# Patient Record
Sex: Female | Born: 1995 | Race: Black or African American | Hispanic: No | Marital: Single | Smoking: Never smoker
Health system: Southern US, Community
[De-identification: ages and names within clinical notes are randomized; demographics above are authoritative.]

## PROBLEM LIST (undated history)

## (undated) ENCOUNTER — Inpatient Hospital Stay (HOSPITAL_COMMUNITY): Payer: Self-pay

## (undated) DIAGNOSIS — R011 Cardiac murmur, unspecified: Secondary | ICD-10-CM

---

## 2016-06-28 ENCOUNTER — Ambulatory Visit: Payer: Self-pay | Admitting: Obstetrics & Gynecology

## 2016-06-29 ENCOUNTER — Inpatient Hospital Stay (HOSPITAL_COMMUNITY)
Admission: AD | Admit: 2016-06-29 | Discharge: 2016-06-29 | Disposition: A | Discharge status: Home / Self Care | Payer: BLUE CROSS/BLUE SHIELD | Source: Ambulatory Visit | Attending: Obstetrics & Gynecology | Admitting: Obstetrics & Gynecology

## 2016-06-29 ENCOUNTER — Encounter (HOSPITAL_COMMUNITY): Payer: Self-pay | Admitting: *Deleted

## 2016-06-29 ENCOUNTER — Inpatient Hospital Stay (HOSPITAL_COMMUNITY): Payer: BLUE CROSS/BLUE SHIELD

## 2016-06-29 DIAGNOSIS — O468X1 Other antepartum hemorrhage, first trimester: Secondary | ICD-10-CM

## 2016-06-29 DIAGNOSIS — R109 Unspecified abdominal pain: Secondary | ICD-10-CM | POA: Diagnosis present

## 2016-06-29 DIAGNOSIS — O98311 Other infections with a predominantly sexual mode of transmission complicating pregnancy, first trimester: Secondary | ICD-10-CM | POA: Insufficient documentation

## 2016-06-29 DIAGNOSIS — Z3A01 Less than 8 weeks gestation of pregnancy: Secondary | ICD-10-CM | POA: Diagnosis not present

## 2016-06-29 DIAGNOSIS — B9689 Other specified bacterial agents as the cause of diseases classified elsewhere: Secondary | ICD-10-CM | POA: Diagnosis not present

## 2016-06-29 DIAGNOSIS — O209 Hemorrhage in early pregnancy, unspecified: Secondary | ICD-10-CM | POA: Diagnosis not present

## 2016-06-29 DIAGNOSIS — Z3201 Encounter for pregnancy test, result positive: Secondary | ICD-10-CM | POA: Diagnosis not present

## 2016-06-29 DIAGNOSIS — O418X10 Other specified disorders of amniotic fluid and membranes, first trimester, not applicable or unspecified: Secondary | ICD-10-CM

## 2016-06-29 DIAGNOSIS — A599 Trichomoniasis, unspecified: Secondary | ICD-10-CM | POA: Diagnosis not present

## 2016-06-29 DIAGNOSIS — Z3A10 10 weeks gestation of pregnancy: Secondary | ICD-10-CM

## 2016-06-29 DIAGNOSIS — N939 Abnormal uterine and vaginal bleeding, unspecified: Secondary | ICD-10-CM | POA: Diagnosis present

## 2016-06-29 DIAGNOSIS — O26899 Other specified pregnancy related conditions, unspecified trimester: Secondary | ICD-10-CM

## 2016-06-29 HISTORY — DX: Cardiac murmur, unspecified: R01.1

## 2016-06-29 LAB — WET PREP, GENITAL
CLUE CELLS WET PREP: NONE SEEN
SPERM: NONE SEEN
Yeast Wet Prep HPF POC: NONE SEEN

## 2016-06-29 LAB — URINALYSIS, ROUTINE W REFLEX MICROSCOPIC
Bacteria, UA: NONE SEEN
Bilirubin Urine: NEGATIVE
GLUCOSE, UA: NEGATIVE mg/dL
KETONES UR: NEGATIVE mg/dL
Nitrite: NEGATIVE
PROTEIN: NEGATIVE mg/dL
Specific Gravity, Urine: 1.02 (ref 1.005–1.030)
pH: 6 (ref 5.0–8.0)

## 2016-06-29 LAB — CBC
HCT: 34.4 % — ABNORMAL LOW (ref 36.0–46.0)
HEMOGLOBIN: 11 g/dL — AB (ref 12.0–15.0)
MCH: 23.1 pg — AB (ref 26.0–34.0)
MCHC: 32 g/dL (ref 30.0–36.0)
MCV: 72.3 fL — AB (ref 78.0–100.0)
Platelets: 247 10*3/uL (ref 150–400)
RBC: 4.76 MIL/uL (ref 3.87–5.11)
RDW: 16.7 % — ABNORMAL HIGH (ref 11.5–15.5)
WBC: 7.1 10*3/uL (ref 4.0–10.5)

## 2016-06-29 LAB — ABO/RH: ABO/RH(D): O NEG

## 2016-06-29 LAB — POCT PREGNANCY, URINE: Preg Test, Ur: POSITIVE — AB

## 2016-06-29 LAB — HCG, QUANTITATIVE, PREGNANCY: HCG, BETA CHAIN, QUANT, S: 5673 m[IU]/mL — AB (ref <5)

## 2016-06-29 MED ORDER — METRONIDAZOLE 500 MG PO TABS
2000.0000 mg | ORAL_TABLET | Freq: Once | ORAL | Status: AC
  Administered 2016-06-29: 2000 mg via ORAL
  Filled 2016-06-29: qty 4

## 2016-06-29 MED ORDER — RHO D IMMUNE GLOBULIN 1500 UNIT/2ML IJ SOSY
300.0000 ug | PREFILLED_SYRINGE | Freq: Once | INTRAMUSCULAR | Status: AC
  Administered 2016-06-29: 300 ug via INTRAMUSCULAR
  Filled 2016-06-29: qty 2

## 2016-06-29 NOTE — MAU Provider Note (Signed)
History   [id number]   Chief Complaint  Patient presents with  . Abdominal Cramping  . Vaginal Bleeding    HPI Debra Bryant is a 21 y.o. female  G1P0 here at presumed [redacted] wks gestation by LMP with report of vaginal bleeding this morning while wiping and lower pelvic cramping, not unilateral in nature.  Pain is rated a 5/10.  Bleeding is described as small amount and occurring x 1.  Plans to receive prenatal care at [facility] in [place].  Lives in [place], however FOB desires her to come to his city of residence for care.     Patient's last menstrual period was 04/19/2016.  OB History  Gravida Para Term Preterm AB Living  1            SAB TAB Ectopic Multiple Live Births               # Outcome Date GA Lbr Len/2nd Weight Sex Delivery Anes PTL Lv  1 Current               Past Medical History:  Diagnosis Date  . Heart murmur     History reviewed. No pertinent family history.  Social History   Social History  . Marital status: Single    Spouse name: N/A  . Number of children: N/A  . Years of education: N/A   Social History Main Topics  . Smoking status: Never Smoker  . Smokeless tobacco: None  . Alcohol use No  . Drug use: No  . Sexual activity: Yes   Other Topics Concern  . None   Social History Narrative  . None    Allergies no known allergies  No current facility-administered medications on file prior to encounter.    No current outpatient prescriptions on file prior to encounter.     Review of Systems  Constitutional: Negative for chills and fever.  Gastrointestinal: Positive for nausea. Negative for constipation, diarrhea and vomiting.  Genitourinary: Positive for pelvic pain (cramping) and vaginal bleeding. Negative for vaginal discharge and vaginal pain.  Neurological: Negative for headaches.  All other systems reviewed and are negative.    Physical Exam   Vitals:   06/29/16 0758  BP: 124/80  Pulse: 93  Resp: 18  Temp: 98.7 F  (37.1 C)  Weight: 178 lb 1.3 oz (80.8 kg)  Height:  (1.651 m)    Physical Exam  Constitutional: She is oriented to person, place, and time. She appears well-developed and well-nourished.  HENT:  Head: Normocephalic.  Neck: Normal range of motion. Neck supple.  Cardiovascular: Normal rate, regular rhythm and normal heart sounds.   Respiratory: Effort normal and breath sounds normal. No respiratory distress.  GI: Soft. There is no tenderness.  Genitourinary: Uterus is enlarged. Cervix exhibits no motion tenderness. There is bleeding (scant; tissue like appearance on cervix) in the vagina.  Musculoskeletal: Normal range of motion. She exhibits no edema.  Neurological: She is alert and oriented to person, place, and time.  Skin: Skin is warm and dry.    MAU Course  Procedures  MDM Results for orders placed or performed during the hospital encounter of 06/29/16 (from the past 24 hour(s))  Urinalysis, Routine w reflex microscopic     Status: Abnormal   Collection Time: 06/29/16  8:01 AM  Result Value Ref Range   Color, Urine YELLOW YELLOW   APPearance HAZY (A) CLEAR   Specific Gravity, Urine 1.020 1.005 - 1.030   pH 6.0 5.0 -  8.0   Glucose, UA NEGATIVE NEGATIVE mg/dL   Hgb urine dipstick SMALL (A) NEGATIVE   Bilirubin Urine NEGATIVE NEGATIVE   Ketones, ur NEGATIVE NEGATIVE mg/dL   Protein, ur NEGATIVE NEGATIVE mg/dL   Nitrite NEGATIVE NEGATIVE   Leukocytes, UA TRACE (A) NEGATIVE   RBC / HPF 0-5 0 - 5 RBC/hpf   WBC, UA 6-30 0 - 5 WBC/hpf   Bacteria, UA NONE SEEN NONE SEEN   Squamous Epithelial / LPF 6-30 (A) NONE SEEN   Mucous PRESENT   Pregnancy, urine POC     Status: Abnormal   Collection Time: 06/29/16  8:18 AM  Result Value Ref Range   Preg Test, Ur POSITIVE (A) NEGATIVE  Wet prep, genital     Status: Abnormal   Collection Time: 06/29/16  9:23 AM  Result Value Ref Range   Yeast Wet Prep HPF POC NONE SEEN NONE SEEN   Trich, Wet Prep PRESENT (A) NONE SEEN   Clue  Cells Wet Prep HPF POC NONE SEEN NONE SEEN   WBC, Wet Prep HPF POC FEW (A) NONE SEEN   Sperm NONE SEEN   CBC     Status: Abnormal   Collection Time: 06/29/16  9:25 AM  Result Value Ref Range   WBC 7.1 4.0 - 10.5 K/uL   RBC 4.76 3.87 - 5.11 MIL/uL   Hemoglobin 11.0 (L) 12.0 - 15.0 g/dL   HCT 16.1 (L) 09.6 - 04.5 %   MCV 72.3 (L) 78.0 - 100.0 fL   MCH 23.1 (L) 26.0 - 34.0 pg   MCHC 32.0 30.0 - 36.0 g/dL   RDW 40.9 (H) 81.1 - 91.4 %   Platelets 247 150 - 400 K/uL  hCG, quantitative, pregnancy     Status: Abnormal   Collection Time: 06/29/16  9:25 AM  Result Value Ref Range   hCG, Beta Chain, Quant, S 5,673 (H) <5 mIU/mL  ABO/Rh     Status: None   Collection Time: 06/29/16  9:25 AM  Result Value Ref Range   ABO/RH(D) O NEG    FINDINGS: Intrauterine gestational sac: Single  Yolk sac:  Not visualized  Embryo:  Not visualized  Cardiac Activity: Not visualized  Heart Rate:   bpm  MSD: 15.7  mm   6 w   2  d  CRL:    mm    w    d                  [place] EDC:  Subchorionic hemorrhage:  Small subchorionic hemorrhage  Maternal uterus/adnexae: No adnexal masses or free fluid. Ultrasound: IMPRESSION: Single intrauterine gestational sac without yolk sac or fetal pole currently. This could be followed with repeat ultrasound in 10-14 days to ensure expected progression.  Small subchorionic hemorrhage.  Assessment and Plan  21 y.o. G1P0 at 6 wks by IUGS > need fetal pole Trichomoniasis Subchorionic Hemorrhage RH negative  Plan: 2 GM flagyl in MAU Rhophylac in MAU Discussed transmission and need for partner treatment Follow-up ultrasound in one week Reviewed bleeding precautions  Marlis Edelson, CNM 06/29/2016 11:59 AM

## 2016-06-29 NOTE — MAU Note (Signed)
Pt reports she went to the doctor yesterday. Went to the BR this mornings had a cramp and wiped and saw some blood. Had intercourse yesterday.

## 2016-06-30 LAB — GC/CHLAMYDIA PROBE AMP (~~LOC~~) NOT AT ARMC
CHLAMYDIA, DNA PROBE: NEGATIVE
NEISSERIA GONORRHEA: NEGATIVE

## 2016-06-30 LAB — RH IG WORKUP (INCLUDES ABO/RH)
ABO/RH(D): O NEG
Antibody Screen: NEGATIVE
GESTATIONAL AGE(WKS): 5
Unit division: 0

## 2016-06-30 LAB — HIV ANTIBODY (ROUTINE TESTING W REFLEX): HIV Screen 4th Generation wRfx: NONREACTIVE

## 2016-07-02 ENCOUNTER — Encounter (HOSPITAL_COMMUNITY): Payer: Self-pay | Admitting: *Deleted

## 2016-07-02 ENCOUNTER — Inpatient Hospital Stay (HOSPITAL_COMMUNITY)
Admission: AD | Admit: 2016-07-02 | Discharge: 2016-07-02 | Disposition: A | Discharge status: Home / Self Care | Payer: BLUE CROSS/BLUE SHIELD | Source: Ambulatory Visit | Attending: Obstetrics & Gynecology | Admitting: Obstetrics & Gynecology

## 2016-07-02 DIAGNOSIS — O209 Hemorrhage in early pregnancy, unspecified: Secondary | ICD-10-CM | POA: Diagnosis present

## 2016-07-02 DIAGNOSIS — K625 Hemorrhage of anus and rectum: Secondary | ICD-10-CM | POA: Insufficient documentation

## 2016-07-02 DIAGNOSIS — R109 Unspecified abdominal pain: Secondary | ICD-10-CM | POA: Diagnosis not present

## 2016-07-02 DIAGNOSIS — Z3A10 10 weeks gestation of pregnancy: Secondary | ICD-10-CM | POA: Diagnosis not present

## 2016-07-02 DIAGNOSIS — O26891 Other specified pregnancy related conditions, first trimester: Secondary | ICD-10-CM | POA: Diagnosis not present

## 2016-07-02 DIAGNOSIS — O0281 Inappropriate change in quantitative human chorionic gonadotropin (hCG) in early pregnancy: Secondary | ICD-10-CM | POA: Diagnosis not present

## 2016-07-02 LAB — CBC
HEMATOCRIT: 33.7 % — AB (ref 36.0–46.0)
Hemoglobin: 10.8 g/dL — ABNORMAL LOW (ref 12.0–15.0)
MCH: 23.1 pg — AB (ref 26.0–34.0)
MCHC: 32 g/dL (ref 30.0–36.0)
MCV: 72.2 fL — AB (ref 78.0–100.0)
PLATELETS: 229 10*3/uL (ref 150–400)
RBC: 4.67 MIL/uL (ref 3.87–5.11)
RDW: 16.5 % — ABNORMAL HIGH (ref 11.5–15.5)
WBC: 7 10*3/uL (ref 4.0–10.5)

## 2016-07-02 LAB — URINALYSIS, ROUTINE W REFLEX MICROSCOPIC
BILIRUBIN URINE: NEGATIVE
Glucose, UA: NEGATIVE mg/dL
Ketones, ur: NEGATIVE mg/dL
Leukocytes, UA: NEGATIVE
NITRITE: NEGATIVE
PH: 7 (ref 5.0–8.0)
Protein, ur: NEGATIVE mg/dL
SPECIFIC GRAVITY, URINE: 1.013 (ref 1.005–1.030)

## 2016-07-02 LAB — HCG, QUANTITATIVE, PREGNANCY: hCG, Beta Chain, Quant, S: 2983 m[IU]/mL — ABNORMAL HIGH (ref <5)

## 2016-07-02 NOTE — MAU Note (Signed)
Has had some light spotting since Thursday, some today and some bleeding from the rectum as well.  She feels like her job is stressing her out.  Also having some mild cramping.

## 2016-07-02 NOTE — MAU Provider Note (Signed)
History     CSN: [id number]  Arrival date and time: 07/02/16 1425  First Provider Initiated Contact with Patient 07/02/16 1519      Chief Complaint  Patient presents with  . Vaginal Bleeding   HPI  Debra Bryant is a 21 y.o. G1P0 at [redacted]w[redacted]d by LMP who presents with rectal & vaginal bleeding. Patient was seen in MAU on 4/26 for vaginal bleeding; had IUGS (no yolk sac) & small [facility] w/BHCG of 5,673. Patient was discharged home with outpatient f/u ultrasound for viability.  Patient reports pink/red spotting has continued since Tuesday. Reports small amount of red rectal bleeding on toilet paper after straining for a BM earlier today. Denies constipation; reports daily BM & that stool is soft. Since BM has had some mild abdominal cramping. Rates pain 4/10. Has not treated. Pain comes & goes.  Denies n/v, dysuria. No intercourse since previous visit.   OB History    Gravida Para Term Preterm AB Living   1             SAB TAB Ectopic Multiple Live Births                  Past Medical History:  Diagnosis Date  . Heart murmur     History reviewed. No pertinent surgical history.  History reviewed. No pertinent family history.  Social History  Substance Use Topics  . Smoking status: Never Smoker  . Smokeless tobacco: Never Used  . Alcohol use No    Allergies: No Known Allergies  Prescriptions Prior to Admission  Medication Sig Dispense Refill Last Dose  . Prenatal Vit-Fe Fumarate-FA (PRENATAL MULTIVITAMIN) TABS tablet Take 1 tablet by mouth daily at 12 noon.   07/02/2016 at Unknown time    Review of Systems  Constitutional: Negative.   Gastrointestinal: Positive for abdominal pain and anal bleeding. Negative for blood in stool, constipation, diarrhea, nausea, rectal pain and vomiting.  Genitourinary: Positive for vaginal bleeding. Negative for dysuria and vaginal discharge.   Physical Exam   Blood pressure 121/73, pulse 93, temperature 97.7 F (36.5 C), temperature source  Oral, resp. rate 16, last menstrual period 04/19/2016.  Physical Exam  Nursing note and vitals reviewed. Constitutional: She is oriented to person, place, and time. She appears well-developed and well-nourished. No distress.  HENT:  Head: Normocephalic and atraumatic.  Eyes: Conjunctivae are normal. Right eye exhibits no discharge. Left eye exhibits no discharge. No scleral icterus.  Neck: Normal range of motion.  Cardiovascular: Normal rate, regular rhythm and normal heart sounds.   No murmur heard. Respiratory: Effort normal and breath sounds normal. No respiratory distress. She has no wheezes.  GI: Soft. Bowel sounds are normal. She exhibits no distension. There is no tenderness. There is no rebound.  Genitourinary: Rectum normal. Rectal exam shows no external hemorrhoid and no fissure. No bleeding in the vagina.  Genitourinary Comments: Cervix closed  Neurological: She is alert and oriented to person, place, and time.  Skin: Skin is warm and dry. She is not diaphoretic.  Psychiatric: She has a normal mood and affect. Her behavior is normal. Judgment and thought content normal.    MAU Course  Procedures Results for orders placed or performed during the hospital encounter of 07/02/16 (from the past 24 hour(s))  Urinalysis, Routine w reflex microscopic     Status: Abnormal   Collection Time: 07/02/16  2:44 PM  Result Value Ref Range   Color, Urine YELLOW YELLOW   APPearance CLEAR CLEAR   Specific  Gravity, Urine 1.013 1.005 - 1.030   pH 7.0 5.0 - 8.0   Glucose, UA NEGATIVE NEGATIVE mg/dL   Hgb urine dipstick SMALL (A) NEGATIVE   Bilirubin Urine NEGATIVE NEGATIVE   Ketones, ur NEGATIVE NEGATIVE mg/dL   Protein, ur NEGATIVE NEGATIVE mg/dL   Nitrite NEGATIVE NEGATIVE   Leukocytes, UA NEGATIVE NEGATIVE   RBC / HPF 0-5 0 - 5 RBC/hpf   WBC, UA 0-5 0 - 5 WBC/hpf   Bacteria, UA RARE (A) NONE SEEN   Squamous Epithelial / LPF 0-5 (A) NONE SEEN  CBC     Status: Abnormal   Collection  Time: 07/02/16  4:10 PM  Result Value Ref Range   WBC 7.0 4.0 - 10.5 K/uL   RBC 4.67 3.87 - 5.11 MIL/uL   Hemoglobin 10.8 (L) 12.0 - 15.0 g/dL   HCT 82.9 (L) 56.2 - 13.0 %   MCV 72.2 (L) 78.0 - 100.0 fL   MCH 23.1 (L) 26.0 - 34.0 pg   MCHC 32.0 30.0 - 36.0 g/dL   RDW 86.5 (H) 78.4 - 69.6 %   Platelets 229 150 - 400 K/uL  hCG, quantitative, pregnancy     Status: Abnormal   Collection Time: 07/02/16  4:10 PM  Result Value Ref Range   hCG, Beta Chain, Quant, S 2,983 (H) <5 mIU/mL    MDM O negative -- rhogham given 4/26 Cervix closed, no blood on exam BHCG -- dropped -- 2952>8413 Hemoglobin stable VSS, NAD Assessment and Plan  A: 1. Vaginal bleeding in pregnancy, first trimester   2. Inappropriate change in quantitative hCG in early pregnancy    P: Discharge home Go to [facility] Wednesday morning for repeat BHCG -- if continues to drop will f/u for miscarriage appt Discussed reasons to return to MAU Pelvic rest  Judeth Horn 07/02/2016, 3:19 PM

## 2016-07-02 NOTE — Discharge Instructions (Signed)
Return to care  °· If you have heavier bleeding that soaks through more that 2 pads per hour for an hour or more °· If you bleed so much that you feel like you might pass out or you do pass out °· If you have significant abdominal pain that is not improved with Tylenol  °· If you develop a fever > 100.5 ° ° ° ° °Miscarriage °A miscarriage is the sudden loss of an unborn baby (fetus) before the 20th week of pregnancy. Most miscarriages happen in the first 3 months of pregnancy. Sometimes, it happens before a woman even knows she is pregnant. A miscarriage is also called a "spontaneous miscarriage" or "early pregnancy loss." Having a miscarriage can be an emotional experience. Talk with your caregiver about any questions you may have about miscarrying, the grieving process, and your future pregnancy plans. °What are the causes? °· Problems with the fetal chromosomes that make it impossible for the baby to develop normally. Problems with the baby's genes or chromosomes are most often the result of errors that occur, by chance, as the embryo divides and grows. The problems are not inherited from the parents. °· Infection of the cervix or uterus. °· Hormone problems. °· Problems with the cervix, such as having an incompetent cervix. This is when the tissue in the cervix is not strong enough to hold the pregnancy. °· Problems with the uterus, such as an abnormally shaped uterus, uterine fibroids, or congenital abnormalities. °· Certain medical conditions. °· Smoking, drinking alcohol, or taking illegal drugs. °· Trauma. °Often, the cause of a miscarriage is unknown. °What are the signs or symptoms? °· Vaginal bleeding or spotting, with or without cramps or pain. °· Pain or cramping in the abdomen or lower back. °· Passing fluid, tissue, or blood clots from the vagina. °How is this diagnosed? °Your caregiver will perform a physical exam. You may also have an ultrasound to confirm the miscarriage. Blood or urine tests may  also be ordered. °How is this treated? °· Sometimes, treatment is not necessary if you naturally pass all the fetal tissue that was in the uterus. If some of the fetus or placenta remains in the body (incomplete miscarriage), tissue left behind may become infected and must be removed. Usually, a dilation and curettage (D and C) procedure is performed. During a D and C procedure, the cervix is widened (dilated) and any remaining fetal or placental tissue is gently removed from the uterus. °· Antibiotic medicines are prescribed if there is an infection. Other medicines may be given to reduce the size of the uterus (contract) if there is a lot of bleeding. °· If you have Rh negative blood and your baby was Rh positive, you will need a Rh immunoglobulin shot. This shot will protect any future baby from having Rh blood problems in future pregnancies. °Follow these instructions at home: °· Your caregiver may order bed rest or may allow you to continue light activity. Resume activity as directed by your caregiver. °· Have someone help with home and family responsibilities during this time. °· Keep track of the number of sanitary pads you use each day and how soaked (saturated) they are. Write down this information. °· Do not use tampons. Do not douche or have sexual intercourse until approved by your caregiver. °· Only take over-the-counter or prescription medicines for pain or discomfort as directed by your caregiver. °· Do not take aspirin. Aspirin can cause bleeding. °· Keep all follow-up appointments with your caregiver. °·   If you or your partner have problems with grieving, talk to your caregiver or seek counseling to help cope with the pregnancy loss. Allow enough time to grieve before trying to get pregnant again. °Get help right away if: °· You have severe cramps or pain in your back or abdomen. °· You have a fever. °· You pass large blood clots (walnut-sized or larger) or tissue from your vagina. Save any tissue  for your caregiver to inspect. °· Your bleeding increases. °· You have a thick, bad-smelling vaginal discharge. °· You become lightheaded, weak, or you faint. °· You have chills. °This information is not intended to replace advice given to you by your health care provider. Make sure you discuss any questions you have with your health care provider. °Document Released: 08/16/2000 Document Revised: 07/29/2015 Document Reviewed: 04/11/2011 °Elsevier Interactive Patient Education © 2017 [employer] Inc. ° °

## 2016-07-05 ENCOUNTER — Telehealth: Payer: Self-pay | Admitting: General Practice

## 2016-07-05 ENCOUNTER — Ambulatory Visit: Payer: BLUE CROSS/BLUE SHIELD

## 2016-07-05 NOTE — Telephone Encounter (Signed)
Patient no showed for stat bhcg appt. Called patient and she states she told them she couldn't make it this week because she is in [place]. Patient states she won't be back until next week but can come Monday 5/7 @ 330 after work. Patient had no questions

## 2016-07-10 ENCOUNTER — Inpatient Hospital Stay (HOSPITAL_COMMUNITY)
Admission: AD | Admit: 2016-07-10 | Discharge: 2016-07-10 | Disposition: A | Discharge status: Home / Self Care | Payer: BLUE CROSS/BLUE SHIELD | Source: Ambulatory Visit | Attending: Obstetrics & Gynecology | Admitting: Obstetrics & Gynecology

## 2016-07-10 ENCOUNTER — Ambulatory Visit: Payer: BLUE CROSS/BLUE SHIELD

## 2016-07-10 ENCOUNTER — Inpatient Hospital Stay (HOSPITAL_COMMUNITY): Payer: BLUE CROSS/BLUE SHIELD

## 2016-07-10 DIAGNOSIS — Z3A14 14 weeks gestation of pregnancy: Secondary | ICD-10-CM | POA: Insufficient documentation

## 2016-07-10 DIAGNOSIS — O021 Missed abortion: Secondary | ICD-10-CM | POA: Diagnosis not present

## 2016-07-10 DIAGNOSIS — O209 Hemorrhage in early pregnancy, unspecified: Secondary | ICD-10-CM

## 2016-07-10 DIAGNOSIS — R011 Cardiac murmur, unspecified: Secondary | ICD-10-CM | POA: Diagnosis not present

## 2016-07-10 DIAGNOSIS — O3680X0 Pregnancy with inconclusive fetal viability, not applicable or unspecified: Secondary | ICD-10-CM

## 2016-07-10 MED ORDER — PROMETHAZINE HCL 12.5 MG PO TABS: 12.5000 mg | ORAL_TABLET | Freq: Four times a day (QID) | ORAL | 0 refills | Status: AC | PRN

## 2016-07-10 MED ORDER — MISOPROSTOL 200 MCG PO TABS: ORAL_TABLET | ORAL | 0 refills | Status: AC

## 2016-07-10 MED ORDER — OXYCODONE-ACETAMINOPHEN 5-325 MG PO TABS: 1.0000 | ORAL_TABLET | Freq: Four times a day (QID) | ORAL | 0 refills | Status: AC | PRN

## 2016-07-10 MED ORDER — IBUPROFEN 600 MG PO TABS: 600.0000 mg | ORAL_TABLET | Freq: Four times a day (QID) | ORAL | 0 refills | Status: AC | PRN

## 2016-07-10 NOTE — Discharge Instructions (Signed)

## 2016-07-10 NOTE — MAU Provider Note (Signed)
History     CSN: [id number]  Arrival date and time: 07/10/16 1538   First Provider Initiated Contact with Patient 07/10/16 1627      Chief Complaint  Patient presents with  . Follow-up   HPI  Debra Bryant is a 21 y.o. G1P0 at [redacted]w[redacted]d who presents to MAU today with complaint of need for follow-up. The patient had IUGS only noted on [place] on 06/29/16 and was advised to follow-up for hCG in 48 hours. The patient was out of town for the past week and did not follow-up. She states continued light bleeding off and on with mild cramping. She tried Ibuprofen once with minimal relief.   OB History    Gravida Para Term Preterm AB Living   1             SAB TAB Ectopic Multiple Live Births                  Past Medical History:  Diagnosis Date  . Heart murmur     No past surgical history on file.  No family history on file.  Social History  Substance Use Topics  . Smoking status: Never Smoker  . Smokeless tobacco: Never Used  . Alcohol use No    Allergies: No Known Allergies  No prescriptions prior to admission.    Review of Systems  Constitutional: Negative for fever.  Gastrointestinal: Positive for abdominal pain. Negative for constipation, diarrhea, nausea and vomiting.  Genitourinary: Positive for vaginal bleeding. Negative for dysuria, frequency, urgency and vaginal discharge.   Physical Exam   Blood pressure 117/70, pulse 79, temperature 98.3 F (36.8 C), temperature source Oral, resp. rate 16, weight 182 lb (82.6 kg), last menstrual period 04/19/2016, SpO2 100 %.  Physical Exam  Nursing note and vitals reviewed. Constitutional: She is oriented to person, place, and time. She appears well-developed and well-nourished. No distress.  HENT:  Head: Normocephalic and atraumatic.  Cardiovascular: Normal rate.   Respiratory: Effort normal.  GI: Soft. She exhibits no distension. There is no tenderness.  Neurological: She is alert and oriented to person, place, and  time.  Skin: Skin is warm and dry. No erythema.  Psychiatric: She has a normal mood and affect.    US Ob Transvaginal  Result Date: 07/10/2016 CLINICAL DATA:  Intermittent vaginal bleeding. EXAM: OBSTETRIC <14 WK [place] AND TRANSVAGINAL OB US TECHNIQUE: Both transabdominal and transvaginal ultrasound examinations were performed for complete evaluation of the gestation as well as the maternal uterus, adnexal regions, and pelvic cul-de-sac. Transvaginal technique was performed to assess early pregnancy. COMPARISON:  None. FINDINGS: Intrauterine gestational sac: There is an irregularly-shaped gestational sac identified. Yolk sac:  No Embryo:  None MSD: 17  mm   6 w   3  d Maternal uterus/adnexae: Subchorionic hemorrhage: Small Right ovary: Normal Left ovary: Normal Other :None Free fluid:  Trace free fluid. IMPRESSION: 1. Irregularly configured gestational sac is identified without yolk sac or embryo. It has been 11 days since prior ultrasound demonstrating a single intrauterine gestational sac without yolk sac or fetal pole. Findings are suspicious but not yet definitive for failed pregnancy. Recommend follow-up US in 10-14 days for definitive diagnosis. This recommendation follows SRU consensus guidelines: Diagnostic Criteria for Nonviable Pregnancy Early in the First Trimester. Malva Limes Med 2013; 409:8119-14. Electronically Signed   By: Signa Kell M.D.   On: 07/10/2016 17:16    MAU Course  Procedures None  MDM Since patient has not had follow-up  for > 7 days, will repeat US today TVUS ordered Findings are consistent with likely missed AB/anembryonic pregnancy Discussed patient with Dr. Debroah LoopArnold. Ok to offer Cytotec today.  Discussed options of expectant management vs Cytotec today. Patient would like to try Cytotec.   Early Intrauterine Pregnancy Failure  __x_  Documented intrauterine pregnancy failure less than or equal to [redacted] weeks gestation  _x__  No serious current illness  _x__  Baseline  Hgb greater than or equal to 10g/dl  __x_  Patient has easily accessible transportation to the hospital  __x_  Clear preference  _x__  Practitioner/physician deems patient reliable  __x_  Counseling by practitioner or physician  __N/A_  Patient education by RN  _N/A__  Consent form signed  __x_  Rho-Gam given by RN if indicated  _x__ Medication dispensed   __x_   Cytotec 800 mcg  _x_   Intravaginally by patient at home         __   Intravaginally by RN in MAU        __   Rectally by patient at home        __   Rectally by RN in MAU  _x__  Ibuprofen 600 mg 1 tablet by mouth every 6 hours as needed #30  __x_  Hydrocodone/acetaminophen 5/325 mg by mouth every 4 to 6 hours as needed  __x_  Phenergan 12.5 mg by mouth every 4 hours as needed for nausea     Assessment and Plan  A: Missed AB  P:  Discharge home Rx for Cytotec, Percocet, Ibuprofen and Phenergan given to patient  Bleeding precautions discussed Patient advised to follow-up with [facility] in 1 week for repeat labs and 2 weeks with a provider. Message sent to [facility] to call with appointments.  Patient may return to MAU as needed or if her condition were to change or worsen  Vonzella NippleJulie Venera Privott, PA-C 07/10/2016, 7:45 PM

## 2016-07-10 NOTE — MAU Note (Signed)
Was to have come back last Monday, was out of town.  Received a phone call told them she would come today when she got off of work.  Still bleeding off and on.   No abd pain, reports headaches off and on.

## 2016-07-18 ENCOUNTER — Other Ambulatory Visit: Payer: BLUE CROSS/BLUE SHIELD

## 2016-07-26 ENCOUNTER — Encounter: Payer: BLUE CROSS/BLUE SHIELD | Admitting: Advanced Practice Midwife

## 2017-04-18 ENCOUNTER — Encounter (HOSPITAL_COMMUNITY): Payer: Self-pay

## 2018-06-18 IMAGING — US US OB TRANSVAGINAL
1 series · 15 of 28 positions shown · non-contrast
Comparison: None.

CLINICAL DATA: Intermittent vaginal bleeding.

EXAM:
OBSTETRIC <14 WK US AND TRANSVAGINAL OB US
TECHNIQUE: Both transabdominal and transvaginal ultrasound examinations were
performed for complete evaluation of the gestation as well as the
maternal uterus, adnexal regions, and pelvic cul-de-sac.
Transvaginal technique was performed to assess early pregnancy.

[Series 1: us ob transvaginal · 15 of 37 slices shown]
[im 1/37]
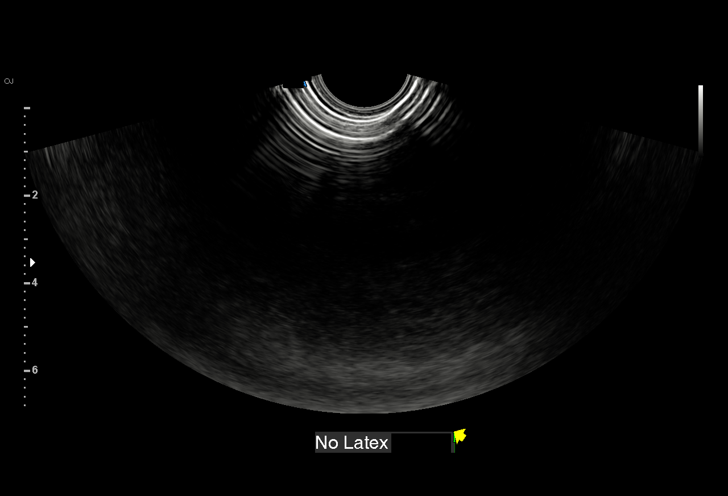
[im 3/37]
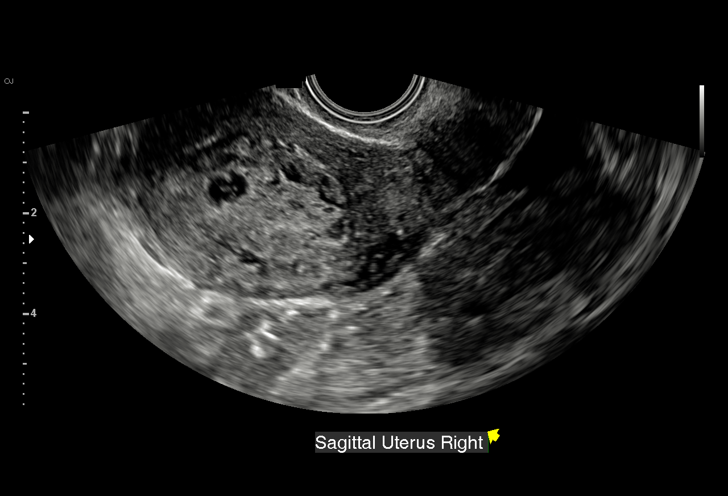
[im 6/37]
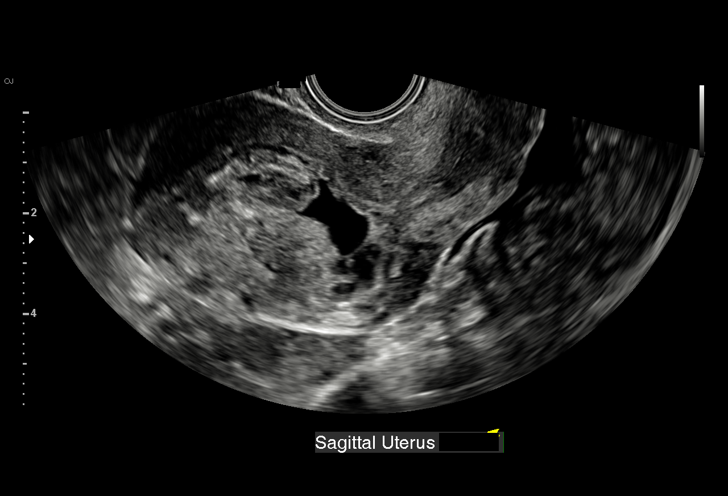
[im 9/37]
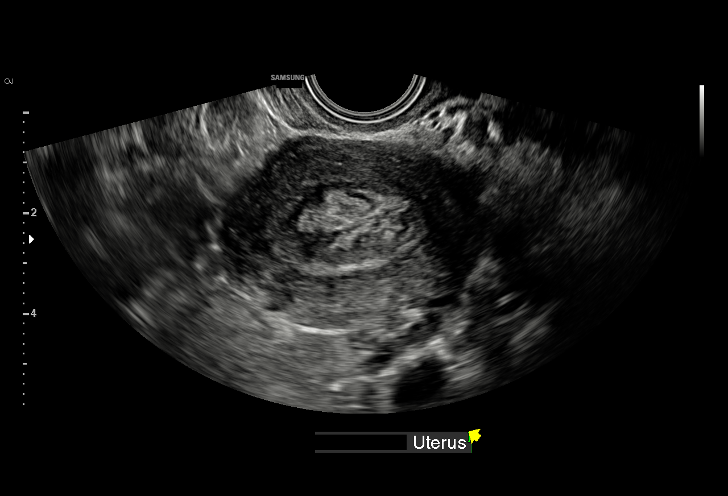
[im 11/37]
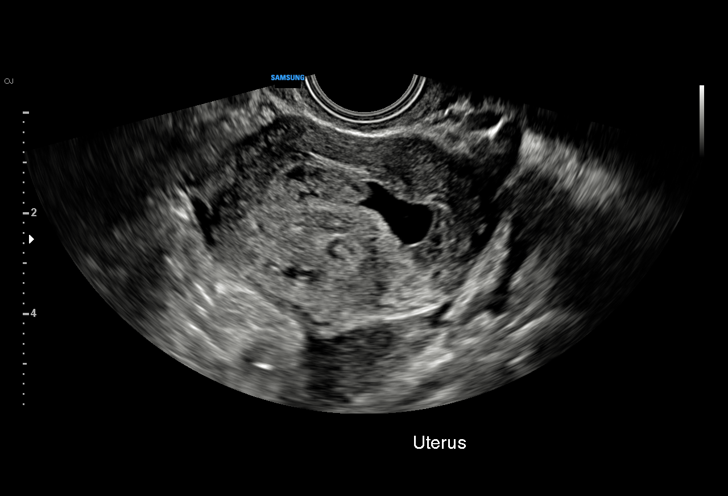
[im 14/37]
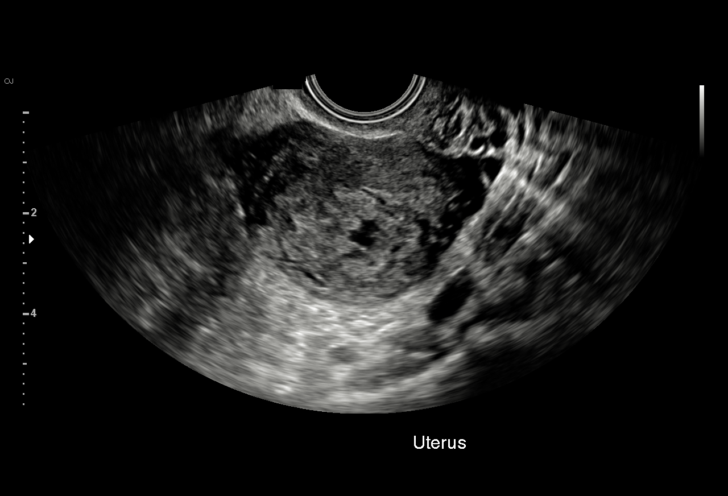
[im 17/37]
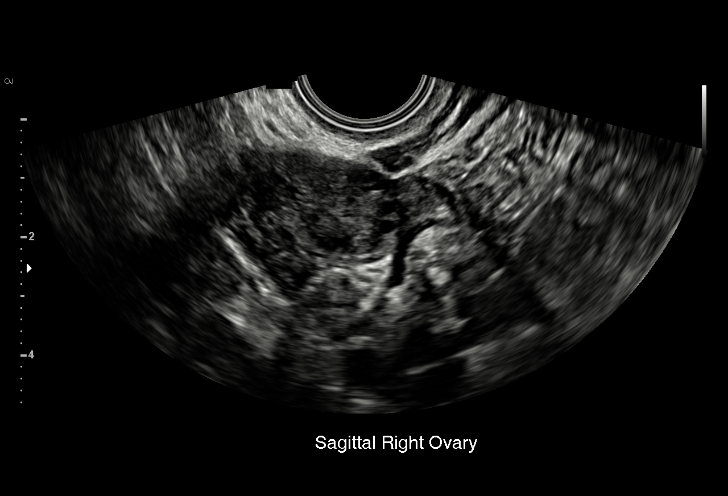
[im 19/37]
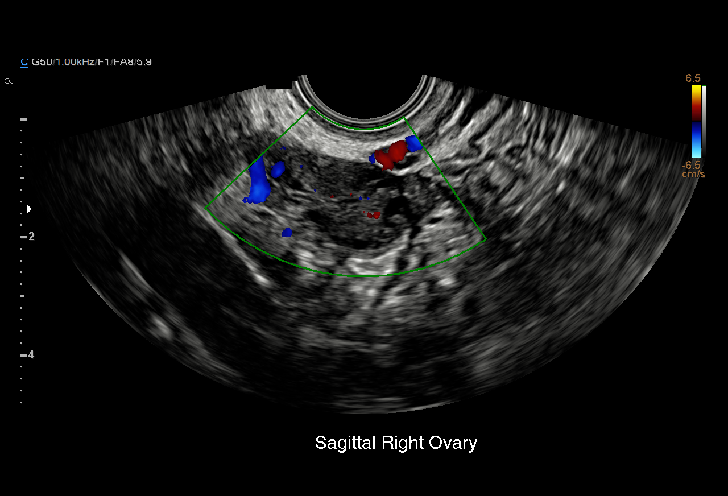
[im 21/37]
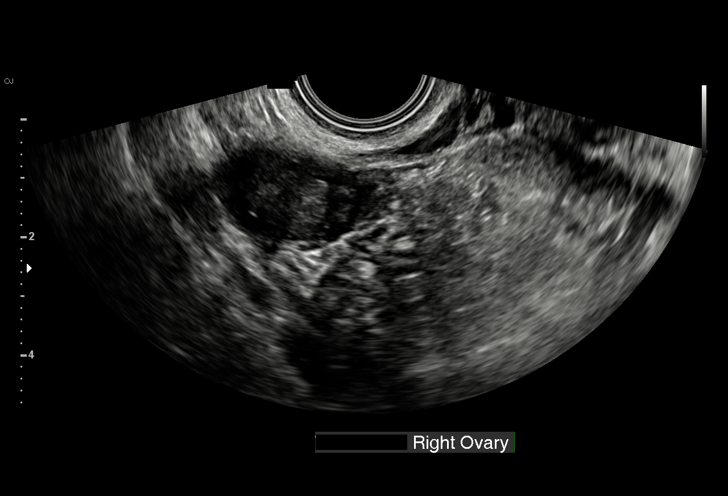
[im 23/37]
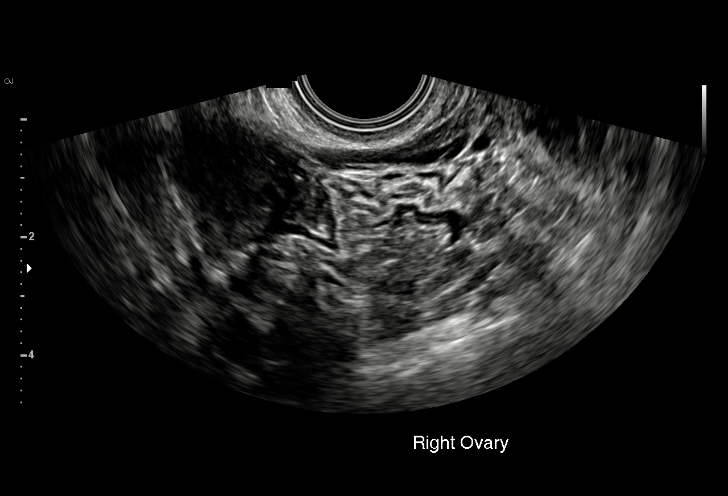
[im 26/37]
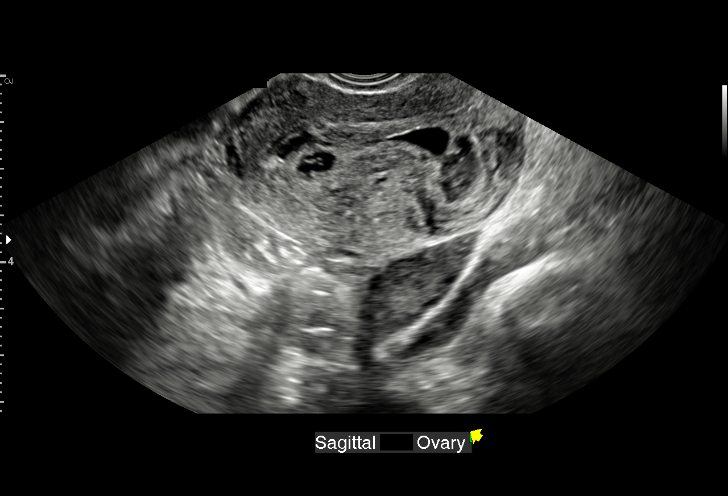
[im 29/37]
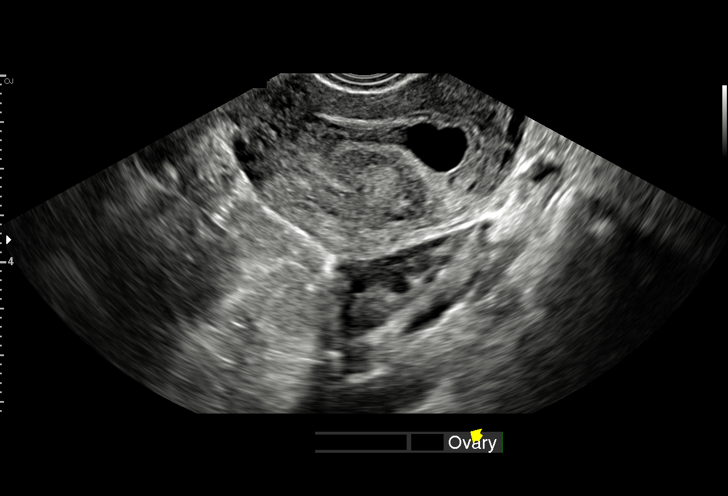
[im 31/37]
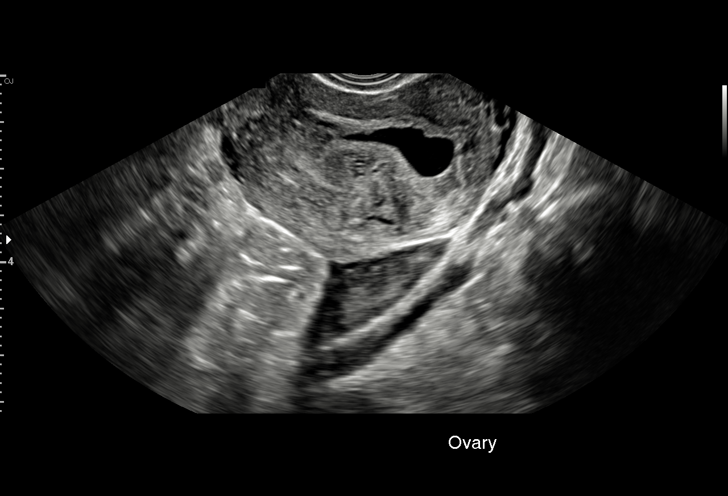
[im 34/37]
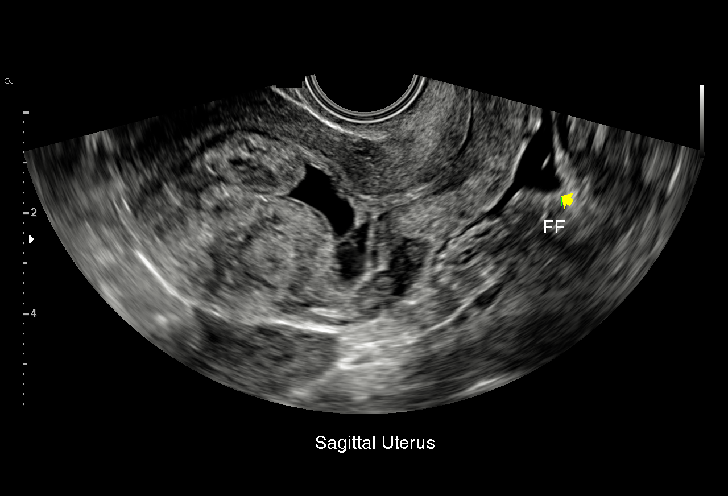
[im 37/37]
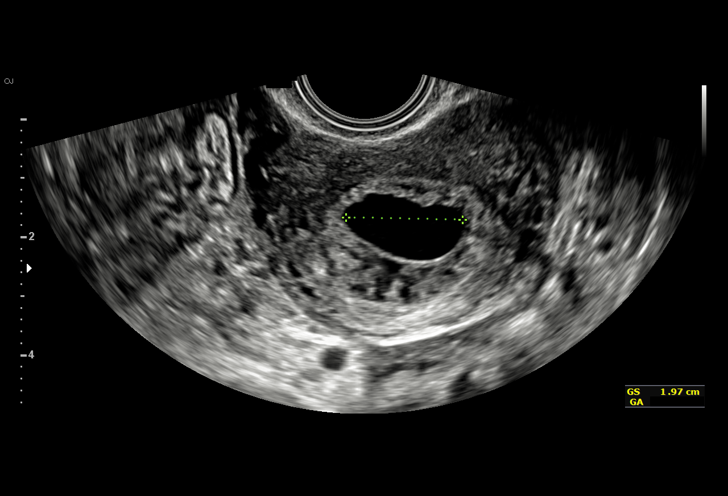

[15 of 28 positions shown; findings below may reference images not displayed]

FINDINGS: Intrauterine gestational sac: There is an irregularly-shaped
gestational sac identified.

Yolk sac:  No

Embryo:  None

MSD: 17  mm   6 w   3  d

Maternal uterus/adnexae:

Subchorionic hemorrhage: Small

Right ovary: Normal

Left ovary: Normal

Other :None

Free fluid:  Trace free fluid.
IMPRESSION: 1. Irregularly configured gestational sac is identified without yolk
sac or embryo. It has been 11 days since prior ultrasound
demonstrating a single intrauterine gestational sac without yolk sac
or fetal pole. Findings are suspicious but not yet definitive for
failed pregnancy. Recommend follow-up US in 10-14 days for
definitive diagnosis. This recommendation follows SRU consensus
guidelines: Diagnostic Criteria for Nonviable Pregnancy Early in the
First Trimester. N Engl J Med 5896; [DATE].
